# Patient Record
Sex: Female | Born: 1945 | Race: Black or African American | Hispanic: No | Smoking: Never smoker
Health system: Southern US, Community
[De-identification: ages and names within clinical notes are randomized; demographics above are authoritative.]

## PROBLEM LIST (undated history)

## (undated) DIAGNOSIS — I10 Essential (primary) hypertension: Secondary | ICD-10-CM

## (undated) DIAGNOSIS — K219 Gastro-esophageal reflux disease without esophagitis: Secondary | ICD-10-CM

## (undated) DIAGNOSIS — G8929 Other chronic pain: Secondary | ICD-10-CM

## (undated) DIAGNOSIS — G43909 Migraine, unspecified, not intractable, without status migrainosus: Secondary | ICD-10-CM

## (undated) DIAGNOSIS — E079 Disorder of thyroid, unspecified: Secondary | ICD-10-CM

## (undated) HISTORY — PX: THYROID SURGERY: SHX805

## (undated) HISTORY — PX: ABDOMINAL HYSTERECTOMY: SHX81

## (undated) HISTORY — PX: KNEE SURGERY: SHX244

---

## 2017-09-12 ENCOUNTER — Other Ambulatory Visit: Payer: Self-pay

## 2017-09-12 ENCOUNTER — Ambulatory Visit
Admission: EM | Admit: 2017-09-12 | Discharge: 2017-09-12 | Disposition: A | Discharge status: Home / Self Care | Payer: Medicare HMO | Attending: Family Medicine | Admitting: Family Medicine

## 2017-09-12 DIAGNOSIS — M1000 Idiopathic gout, unspecified site: Secondary | ICD-10-CM

## 2017-09-12 DIAGNOSIS — M25532 Pain in left wrist: Secondary | ICD-10-CM

## 2017-09-12 DIAGNOSIS — M25531 Pain in right wrist: Secondary | ICD-10-CM

## 2017-09-12 DIAGNOSIS — M79675 Pain in left toe(s): Secondary | ICD-10-CM

## 2017-09-12 DIAGNOSIS — M79674 Pain in right toe(s): Secondary | ICD-10-CM | POA: Diagnosis not present

## 2017-09-12 HISTORY — DX: Other chronic pain: G89.29

## 2017-09-12 HISTORY — DX: Essential (primary) hypertension: I10

## 2017-09-12 HISTORY — DX: Gastro-esophageal reflux disease without esophagitis: K21.9

## 2017-09-12 HISTORY — DX: Migraine, unspecified, not intractable, without status migrainosus: G43.909

## 2017-09-12 HISTORY — DX: Disorder of thyroid, unspecified: E07.9

## 2017-09-12 MED ORDER — PREDNISONE 20 MG PO TABS: 20.0000 mg | ORAL_TABLET | Freq: Every day | ORAL | 0 refills | Status: DC

## 2017-09-12 NOTE — ED Triage Notes (Signed)
Pt states she had a flare a few months ago where her hands joints were swelling. She took someone's Gout medication and it got better. Pt states since Thursday she is having both hand and feet joint pain. Pain 7/10

## 2017-09-13 NOTE — ED Provider Notes (Signed)
[facility]    CSN: [id number] Arrival date & time: 09/12/17  1338     History   Chief Complaint Chief Complaint  Patient presents with  . Hand Pain  . Foot Pain    HPI Debra Rice is a 72 y.o. female.   72 yo female with a big toe pain (left greater than right) as some mild hand/wrist pain after eating seafood 3 days ago. States she has a h/o gout with similar presentation in the past. Denies any falls, injuries, fevers, chills.   The history is provided by the patient.  Hand Pain   Foot Pain     Past Medical History:  Diagnosis Date  . Chronic pain   . GERD (gastroesophageal reflux disease)   . Hypertension   . Migraines   . Thyroid disease     There are no active problems to display for this patient.   Past Surgical History:  Procedure Laterality Date  . ABDOMINAL HYSTERECTOMY    . KNEE SURGERY    . THYROID SURGERY      OB History   None      Home Medications    Prior to Admission medications   Medication Sig Start Date End Date Taking? Authorizing Provider  budesonide-formoterol (SYMBICORT) 160-4.5 MCG/ACT inhaler Inhale into the lungs. 04/07/12  Yes [provider]  cyclobenzaprine (FLEXERIL) 10 MG tablet Take by mouth. 11/20/14  Yes [provider]  diclofenac sodium (VOLTAREN) 1 % GEL Reported on 06/07/2015 as needed 01/11/15  Yes [provider]  furosemide (LASIX) 40 MG tablet Take by mouth. 06/11/16  Yes [provider]  levothyroxine (SYNTHROID, LEVOTHROID) 137 MCG tablet Take by mouth. 08/25/13  Yes [provider]  omeprazole (PRILOSEC) 20 MG capsule Take by mouth. 07/14/13  Yes [provider]  oxyCODONE-acetaminophen (PERCOCET) 10-325 MG tablet TAKE 1 TO 2 TABLETS BY MOUTH THREE TIMES DAILY AS NEEDED FOR SEVERE PAIN 08/15/15  Yes [provider]  rizatriptan (MAXALT) 10 MG tablet TAKE ONE TABLET BY MOUTH FOR HEADACHE. may repeat once in 2 hours maximum of 3 tablets  daily 03/31/16  Yes [provider]  telmisartan (MICARDIS) 80 MG tablet Take by mouth. 04/28/16  Yes [provider]  topiramate (TOPAMAX) 50 MG tablet Take by mouth. 05/01/16  Yes [provider]  zolpidem (AMBIEN CR) 12.5 MG CR tablet Take by mouth. 07/04/13  Yes [provider]  albuterol (PROVENTIL HFA;VENTOLIN HFA) 108 (90 Base) MCG/ACT inhaler Inhale into the lungs.    [provider]  aspirin EC 81 MG tablet Take by mouth.    [provider]  hydrochlorothiazide (MICROZIDE) 12.5 MG capsule Take 12.5 mg by mouth daily. 07/06/17   [provider]  predniSONE (DELTASONE) 20 MG tablet Take 1 tablet (20 mg total) by mouth daily. 09/12/17   Payton Mccallumonty, Keishla Oyer, MD  telmisartan (MICARDIS) 80 MG tablet Take 80 mg by mouth daily. for blood pressure 08/06/17   [provider]    Family History History reviewed. No pertinent family history.  Social History Social History   Tobacco Use  . Smoking status: Never Smoker  . Smokeless tobacco: Never Used  Substance Use Topics  . Alcohol use: Not Currently  . Drug use: Not Currently     Allergies   Hydrocodone-acetaminophen; Oxycodone; Ciprofloxacin; Erythromycin; Iodine; and Sulfa antibiotics   Review of Systems Review of Systems   Physical Exam Triage Vital Signs ED Triage Vitals  Enc Vitals Group  BP 09/12/17 1352 (!) 145/70     Pulse Rate 09/12/17 1352 71     Resp 09/12/17 1352 18     Temp 09/12/17 1352 98 F (36.7 C)     Temp Source 09/12/17 1352 Oral     SpO2 09/12/17 1352 98 %     Weight 09/12/17 1355 (!) 308 lb (139.7 kg)     Height 09/12/17 1355 5\' 7"  (1.702 m)     Head Circumference --      Peak Flow --      Pain Score 09/12/17 1353 7     Pain Loc --      Pain Edu? --      Excl. in GC? --    No data found.  Updated Vital Signs BP (!) 145/70 (BP Location: Right Arm)   Pulse 71   Temp 98 F (36.7 C) (Oral)   Resp 18   Ht 5\' 7"  (1.702 m)   Wt (!)  139.7 kg   SpO2 98%   BMI 48.24 kg/m   Visual Acuity Right Eye Distance:   Left Eye Distance:   Bilateral Distance:    Right Eye Near:   Left Eye Near:    Bilateral Near:     Physical Exam  Constitutional: She appears well-developed and well-nourished. No distress.  Musculoskeletal:       Right hand: She exhibits swelling (mild).       Left hand: She exhibits swelling (mild).       Right foot: There is tenderness, bony tenderness and swelling. There is normal range of motion, normal capillary refill, no crepitus, no deformity and no laceration.       Left foot: There is tenderness, bony tenderness and swelling. There is normal range of motion, normal capillary refill, no crepitus, no deformity and no laceration.  To left MTP joint and right MTP joint  Skin: She is not diaphoretic.  Nursing note and vitals reviewed.    UC Treatments / Results  Labs (all labs ordered are listed, but only abnormal results are displayed) Labs Reviewed - No data to display  EKG None  Radiology No results found.  Procedures Procedures (including critical care time)  Medications Ordered in UC Medications - No data to display  Initial Impression / Assessment and Plan / UC Course  I have reviewed the triage vital signs and the nursing notes.  Pertinent labs & imaging results that were available during my care of the patient were reviewed by me and considered in my medical decision making (see chart for details).     Final Clinical Impressions(s) / UC Diagnoses   Final diagnoses:  Acute idiopathic gout, unspecified site   Discharge Instructions   None    ED Prescriptions    Medication Sig Dispense Auth. Provider   predniSONE (DELTASONE) 20 MG tablet Take 1 tablet (20 mg total) by mouth daily. 5 tablet Payton Mccallum, MD     1. diagnosis reviewed with patient 2. rx as per orders above; reviewed possible side effects, interactions, risks and benefits  3. Recommend supportive  treatment with otc analgesic prn 4. Follow-up prn if symptoms worsen or don't improve  Controlled Substance Prescriptions [place] Controlled Substance Registry consulted? Not Applicable   Payton Mccallum, MD 09/13/17 [phone]

## 2017-10-22 ENCOUNTER — Ambulatory Visit (INDEPENDENT_AMBULATORY_CARE_PROVIDER_SITE_OTHER): Payer: Medicare HMO

## 2017-10-22 ENCOUNTER — Other Ambulatory Visit: Payer: Self-pay

## 2017-10-22 ENCOUNTER — Ambulatory Visit
Admission: EM | Admit: 2017-10-22 | Discharge: 2017-10-22 | Disposition: A | Discharge status: Home / Self Care | Payer: Medicare HMO | Attending: Emergency Medicine | Admitting: Emergency Medicine

## 2017-10-22 DIAGNOSIS — S60222A Contusion of left hand, initial encounter: Secondary | ICD-10-CM | POA: Diagnosis not present

## 2017-10-22 DIAGNOSIS — M25532 Pain in left wrist: Secondary | ICD-10-CM | POA: Diagnosis not present

## 2017-10-22 DIAGNOSIS — R51 Headache: Secondary | ICD-10-CM

## 2017-10-22 DIAGNOSIS — S161XXA Strain of muscle, fascia and tendon at neck level, initial encounter: Secondary | ICD-10-CM | POA: Diagnosis not present

## 2017-10-22 DIAGNOSIS — R519 Headache, unspecified: Secondary | ICD-10-CM

## 2017-10-22 MED ORDER — BACLOFEN 5 MG PO TABS: 5.0000 mg | ORAL_TABLET | Freq: Two times a day (BID) | ORAL | 0 refills | Status: AC

## 2017-10-22 MED ORDER — PREDNISONE 20 MG PO TABS: 40.0000 mg | ORAL_TABLET | Freq: Every day | ORAL | 0 refills | Status: AC
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
## 2017-10-22 NOTE — Discharge Instructions (Addendum)
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  There is no fracture or dislocation.  You can try the Voltaren gel on this area in addition to your neck.  The prednisone will help with the swelling and also will help with the neck pain/headache.  Baclofen will help with the muscle spasms that she had on that left trapezius.  Ice your hand for 20 minutes at a time.  Go immediately to the ER for vomiting, fevers, lights bothering eyes, visual changes, strokelike symptoms, or for severe headache that does not respond to Tylenol.  Take 1 g of Tylenol 3 or 4 times a day as needed for pain.

## 2018-05-16 ENCOUNTER — Ambulatory Visit (INDEPENDENT_AMBULATORY_CARE_PROVIDER_SITE_OTHER): Payer: Medicare HMO

## 2018-05-16 ENCOUNTER — Ambulatory Visit
Admission: EM | Admit: 2018-05-16 | Discharge: 2018-05-16 | Disposition: A | Discharge status: Home / Self Care | Payer: Medicare HMO | Attending: Family Medicine | Admitting: Family Medicine

## 2018-05-16 ENCOUNTER — Other Ambulatory Visit: Payer: Self-pay

## 2018-05-16 DIAGNOSIS — M25532 Pain in left wrist: Secondary | ICD-10-CM

## 2018-05-16 DIAGNOSIS — M79671 Pain in right foot: Secondary | ICD-10-CM

## 2018-05-16 DIAGNOSIS — M542 Cervicalgia: Secondary | ICD-10-CM

## 2018-05-16 MED ORDER — PREDNISONE 20 MG PO TABS: 40.0000 mg | ORAL_TABLET | Freq: Every day | ORAL | 0 refills | Status: AC
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
## 2018-05-16 NOTE — Discharge Instructions (Signed)
Take medication as prescribed. Rest. Drink plenty of fluids. Ice/Heat.  Follow up with your primary care physician this week as needed. Return to Urgent care for new or worsening concerns.   

## 2018-12-13 ENCOUNTER — Other Ambulatory Visit: Payer: Self-pay

## 2018-12-13 ENCOUNTER — Ambulatory Visit
Admission: EM | Admit: 2018-12-13 | Discharge: 2018-12-13 | Disposition: A | Discharge status: Home / Self Care | Payer: Medicare HMO | Attending: Family Medicine | Admitting: Family Medicine

## 2018-12-13 DIAGNOSIS — R05 Cough: Secondary | ICD-10-CM

## 2018-12-13 DIAGNOSIS — R197 Diarrhea, unspecified: Secondary | ICD-10-CM

## 2018-12-13 DIAGNOSIS — B349 Viral infection, unspecified: Secondary | ICD-10-CM

## 2018-12-13 MED ORDER — DIPHENOXYLATE-ATROPINE 2.5-0.025 MG PO TABS
1.0000 | ORAL_TABLET | Freq: Four times a day (QID) | ORAL | 0 refills | Status: AC | PRN
Start: 2018-12-13 — End: ?

## 2018-12-13 MED ORDER — BENZONATATE 200 MG PO CAPS: 200.0000 mg | ORAL_CAPSULE | Freq: Three times a day (TID) | ORAL | 0 refills | Status: AC | PRN

## 2018-12-13 NOTE — Discharge Instructions (Signed)
Rest.  Fluids.  Medication as prescribed.  Awaiting COVID test results.

## 2018-12-13 NOTE — ED Triage Notes (Signed)
Pt with Diarrhea and cough since Saturday

## 2018-12-13 NOTE — ED Provider Notes (Signed)
[facility]    CSN: [id number] Arrival date & time: 12/13/18  1635  History   Chief Complaint Chief Complaint  Patient presents with  . Diarrhea  . Cough   HPI  73 year old female presents with diarrhea and cough.  Patient reports that her symptoms started on Saturday.  She reports cough and diarrhea.  Her grandson has similar symptoms.  No reported sick contacts other than her grandson.  No fever.  She has tried some over-the-counter medication without resolution.  No known exacerbating factors.  She is concerned about the possibility for COVID-19.  No other associated symptoms.  No other complaints.  PMH, Surgical Hx, Family Hx, Social History reviewed and updated as below.  Past Medical History:  Diagnosis Date  . Chronic pain   . GERD (gastroesophageal reflux disease)   . Hypertension   . Migraines   . Thyroid disease    Past Surgical History:  Procedure Laterality Date  . ABDOMINAL HYSTERECTOMY    . KNEE SURGERY    . THYROID SURGERY     OB History   No obstetric history on file.    Home Medications    Prior to Admission medications   Medication Sig Start Date End Date Taking? Authorizing Provider  albuterol (PROVENTIL HFA;VENTOLIN HFA) 108 (90 Base) MCG/ACT inhaler Inhale into the lungs.    [provider]  aspirin EC 81 MG tablet Take by mouth.    [provider]  benzonatate (TESSALON) 200 MG capsule Take 1 capsule (200 mg total) by mouth 3 (three) times daily as needed for cough. 12/13/18   Tommie Samsook, Yanice Maqueda G, DO  budesonide-formoterol (SYMBICORT) 160-4.5 MCG/ACT inhaler Inhale into the lungs. 04/07/12   [provider]  Butalbital-APAP-Caffeine 50-300-40 MG CAPS Take by mouth. 05/14/18   [provider]  cyclobenzaprine (FLEXERIL) 10 MG tablet Take by mouth. 11/20/14   [provider]  diclofenac sodium (VOLTAREN) 1 % GEL Reported on 06/07/2015 as needed 01/11/15   [provider]  diphenoxylate-atropine  (LOMOTIL) 2.5-0.025 MG tablet Take 1 tablet by mouth 4 (four) times daily as needed for diarrhea or loose stools. 12/13/18   Tommie Samsook, Michaiah Maiden G, DO  furosemide (LASIX) 40 MG tablet Take by mouth. 06/11/16   [provider]  hydrochlorothiazide (MICROZIDE) 12.5 MG capsule Take 12.5 mg by mouth daily. 07/06/17   [provider]  levothyroxine (SYNTHROID, LEVOTHROID) 137 MCG tablet Take by mouth. 08/25/13   [provider]  omeprazole (PRILOSEC) 20 MG capsule Take by mouth. 07/14/13   [provider]  oxyCODONE-acetaminophen (PERCOCET) 10-325 MG tablet TAKE 1 TO 2 TABLETS BY MOUTH THREE TIMES DAILY AS NEEDED FOR SEVERE PAIN 08/15/15   [provider]  predniSONE (DELTASONE) 20 MG tablet Take 2 tablets (40 mg total) by mouth daily. 05/16/18   Renford DillsMiller, [place], NP  rizatriptan (MAXALT) 10 MG tablet TAKE ONE TABLET BY MOUTH FOR HEADACHE. may repeat once in 2 hours maximum of 3 tablets daily 03/31/16   [provider]  telmisartan (MICARDIS) 80 MG tablet Take 80 mg by mouth daily. for blood pressure 08/06/17   [provider]  topiramate (TOPAMAX) 50 MG tablet Take by mouth. 05/01/16   [provider]   Social History Social History   Tobacco Use  . Smoking status: Never Smoker  . Smokeless tobacco: Never Used  Substance Use Topics  . Alcohol use: Not Currently  . Drug use: Not Currently    Allergies   Hydrocodone-acetaminophen, Oxycodone, Ciprofloxacin, Erythromycin, Iodine, and  Sulfa antibiotics   Review of Systems Review of Systems  Respiratory: Positive for cough.   Gastrointestinal: Positive for diarrhea.   Physical Exam Triage Vital Signs ED Triage Vitals  Enc Vitals Group     BP 12/13/18 1647 123/79     Pulse Rate 12/13/18 1647 79     Resp 12/13/18 1647 18     Temp 12/13/18 1647 98.6 F (37 C)     Temp Source 12/13/18 1647 Oral     SpO2 12/13/18 1647 99 %     Weight 12/13/18 1648 295 lb (133.8 kg)     Height 12/13/18 1648  5\' 7"  (1.702 m)     Head Circumference --      Peak Flow --      Pain Score 12/13/18 1647 0     Pain Loc --      Pain Edu? --      Excl. in GC? --    Updated Vital Signs BP 123/79 (BP Location: Left Arm)   Pulse 79   Temp 98.6 F (37 C) (Oral)   Resp 18   Ht 5\' 7"  (1.702 m)   Wt 133.8 kg   SpO2 99%   BMI 46.20 kg/m   Visual Acuity Right Eye Distance:   Left Eye Distance:   Bilateral Distance:    Right Eye Near:   Left Eye Near:    Bilateral Near:     Physical Exam Vitals signs and nursing note reviewed.  Constitutional:      General: She is not in acute distress.    Appearance: Normal appearance. She is obese. She is not ill-appearing.  HENT:     Head: Normocephalic and atraumatic.  Eyes:     General:        Right eye: No discharge.        Left eye: No discharge.     Conjunctiva/sclera: Conjunctivae normal.  Cardiovascular:     Rate and Rhythm: Normal rate and regular rhythm.     Heart sounds: No murmur.  Pulmonary:     Effort: Pulmonary effort is normal.     Breath sounds: Normal breath sounds. No wheezing, rhonchi or rales.  Abdominal:     General: There is no distension.     Palpations: Abdomen is soft.     Tenderness: There is no abdominal tenderness.  Neurological:     Mental Status: She is alert.  Psychiatric:        Mood and Affect: Mood normal.        Behavior: Behavior normal.    UC Treatments / Results  Labs (all labs ordered are listed, but only abnormal results are displayed) Labs Reviewed  NOVEL CORONAVIRUS, NAA (HOSPITAL ORDER, SEND-OUT TO REF LAB)    EKG   Radiology No results found.  Procedures Procedures (including critical care time)  Medications Ordered in UC Medications - No data to display  Initial Impression / Assessment and Plan / UC Course  I have reviewed the triage vital signs and the nursing notes.  Pertinent labs & imaging results that were available during my care of the patient were reviewed by me and  considered in my medical decision making (see chart for details).    73 year old female presents with a suspected viral illness.  Awaiting Covid test results.  Tessalon Perles for cough.  Lomotil for diarrhea.  Supportive care.  Final Clinical Impressions(s) / UC Diagnoses   Final diagnoses:  Viral illness     Discharge Instructions  Rest.  Fluids.  Medication as prescribed.  Awaiting COVID test results.   ED Prescriptions    Medication Sig Dispense Auth. Provider   diphenoxylate-atropine (LOMOTIL) 2.5-0.025 MG tablet Take 1 tablet by mouth 4 (four) times daily as needed for diarrhea or loose stools. 30 tablet Vyctoria Dickman G, DO   benzonatate (TESSALON) 200 MG capsule Take 1 capsule (200 mg total) by mouth 3 (three) times daily as needed for cough. 20 capsule Coral Spikes, DO     PDMP not reviewed this encounter.   Coral Spikes, [place] 12/13/18 2027

## 2018-12-15 LAB — NOVEL CORONAVIRUS, NAA (HOSP ORDER, SEND-OUT TO REF LAB; TAT 18-24 HRS): SARS-CoV-2, NAA: NOT DETECTED

## 2020-02-17 ENCOUNTER — Ambulatory Visit
Admission: EM | Admit: 2020-02-17 | Discharge: 2020-02-17 | Disposition: A | Discharge status: Home / Self Care | Payer: Medicare HMO | Attending: Family Medicine | Admitting: Family Medicine

## 2020-02-17 ENCOUNTER — Other Ambulatory Visit: Payer: Self-pay

## 2020-02-17 DIAGNOSIS — Z20822 Contact with and (suspected) exposure to covid-19: Secondary | ICD-10-CM | POA: Diagnosis not present

## 2020-02-17 NOTE — ED Triage Notes (Signed)
Patient states that she was exposed to covid and would like to be tested, no symptoms.

## 2020-02-17 NOTE — Discharge Instructions (Signed)

## 2020-02-18 LAB — SARS CORONAVIRUS 2 (TAT 6-24 HRS): SARS Coronavirus 2: NEGATIVE

## 2020-06-15 ENCOUNTER — Ambulatory Visit
Admission: EM | Admit: 2020-06-15 | Discharge: 2020-06-15 | Disposition: A | Discharge status: Home / Self Care | Payer: Medicare HMO | Attending: Sports Medicine | Admitting: Sports Medicine

## 2020-06-15 ENCOUNTER — Other Ambulatory Visit: Payer: Self-pay

## 2020-06-15 DIAGNOSIS — Z20822 Contact with and (suspected) exposure to covid-19: Secondary | ICD-10-CM | POA: Insufficient documentation

## 2020-06-15 LAB — SARS CORONAVIRUS 2 (TAT 6-24 HRS): SARS Coronavirus 2: NEGATIVE

## 2020-06-15 NOTE — ED Triage Notes (Signed)
Pt presents to [facility] for covid testing. Pr denies symptoms at this time.

## 2020-06-15 NOTE — Discharge Instructions (Signed)

## 2020-10-19 ENCOUNTER — Other Ambulatory Visit: Payer: Self-pay

## 2020-10-19 ENCOUNTER — Encounter: Payer: Self-pay | Admitting: Emergency Medicine

## 2020-10-19 ENCOUNTER — Ambulatory Visit
Admission: EM | Admit: 2020-10-19 | Discharge: 2020-10-19 | Disposition: A | Discharge status: Home / Self Care | Payer: Medicare HMO | Attending: Physician Assistant | Admitting: Physician Assistant

## 2020-10-19 DIAGNOSIS — Z0189 Encounter for other specified special examinations: Secondary | ICD-10-CM | POA: Diagnosis not present

## 2020-10-19 DIAGNOSIS — Z20822 Contact with and (suspected) exposure to covid-19: Secondary | ICD-10-CM | POA: Insufficient documentation

## 2020-10-19 DIAGNOSIS — Z1152 Encounter for screening for COVID-19: Secondary | ICD-10-CM | POA: Diagnosis not present

## 2020-10-19 LAB — SARS CORONAVIRUS 2 (TAT 6-24 HRS): SARS Coronavirus 2: NEGATIVE

## 2020-10-19 NOTE — ED Triage Notes (Signed)
Patient states that she needs to be tested for COVID.  Patient states that she is not having any symptoms.

## 2020-10-19 NOTE — Discharge Instructions (Signed)

## 2020-10-24 ENCOUNTER — Ambulatory Visit
Admission: EM | Admit: 2020-10-24 | Discharge: 2020-10-24 | Disposition: A | Discharge status: Home / Self Care | Payer: Medicare HMO | Attending: Family Medicine | Admitting: Family Medicine

## 2020-10-24 ENCOUNTER — Other Ambulatory Visit: Payer: Self-pay

## 2020-10-24 DIAGNOSIS — Z20822 Contact with and (suspected) exposure to covid-19: Secondary | ICD-10-CM | POA: Insufficient documentation

## 2020-10-24 NOTE — ED Triage Notes (Signed)
Pt requests COVID screening due to possible exposure via a family member. 

## 2020-10-25 ENCOUNTER — Telehealth: Payer: Self-pay

## 2020-10-25 LAB — SARS CORONAVIRUS 2 (TAT 6-24 HRS): SARS Coronavirus 2: NEGATIVE

## 2020-10-25 NOTE — Telephone Encounter (Signed)
Negative COVID results given. Patient results "NOT Detected." Caller expressed understanding. ° °
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
## 2022-07-20 ENCOUNTER — Ambulatory Visit
Admission: EM | Admit: 2022-07-20 | Discharge: 2022-07-20 | Disposition: A | Discharge status: Home / Self Care | Attending: Family | Admitting: Family

## 2022-07-20 ENCOUNTER — Encounter: Payer: Self-pay | Admitting: *Deleted

## 2022-07-20 ENCOUNTER — Other Ambulatory Visit: Payer: Self-pay

## 2022-07-20 DIAGNOSIS — J069 Acute upper respiratory infection, unspecified: Secondary | ICD-10-CM

## 2022-07-20 MED ORDER — PROMETHAZINE-DM 6.25-15 MG/5ML PO SYRP: 5.0000 mL | ORAL_SOLUTION | Freq: Four times a day (QID) | ORAL | 0 refills | Status: AC | PRN

## 2022-07-20 MED ORDER — FLUTICASONE PROPIONATE 50 MCG/ACT NA SUSP: 1.0000 | Freq: Every day | NASAL | 0 refills | Status: AC

## 2022-07-20 MED ORDER — BENZONATATE 100 MG PO CAPS: 100.0000 mg | ORAL_CAPSULE | Freq: Three times a day (TID) | ORAL | 0 refills | Status: AC | PRN

## 2022-07-20 NOTE — ED Triage Notes (Signed)
Pt reports going to a meeting  and has had a cough and vomiting since.

## 2022-07-20 NOTE — ED Provider Notes (Signed)
[facility]    CSN: [id number] Arrival date & time: 07/20/22  0820      History   Chief Complaint Chief Complaint  Patient presents with   Cough   Emesis    HPI Debra Rice is a 77 y.o. female presenting to the care this morning with complaints of cough and some chills.  She reports she went to [facility] for a [place] conference on Friday, where everybody was coughing and a few hours later she started experiencing chills and had to leave the room. Had emesis x1. Patient left the conference and went to her room with the chills that continued, reports decrease in appetite. Since then patient has been having dry cough and runny nose.  Has been drinking enough water report and has been using [employer] as needed for symptoms.  She reports decreased appetite. Denies fever, chest pain, shortness of breath, sore throat, headaches, or tiredness.  Cough Associated symptoms: rhinorrhea   Emesis Associated symptoms: cough     Past Medical History:  Diagnosis Date   Chronic pain    GERD (gastroesophageal reflux disease)    Hypertension    Migraines    Thyroid disease     There are no problems to display for this patient.   Past Surgical History:  Procedure Laterality Date   ABDOMINAL HYSTERECTOMY     KNEE SURGERY     THYROID SURGERY      OB History   No obstetric history on file.      Home Medications    Prior to Admission medications   Medication Sig Start Date End Date Taking? Authorizing Provider  albuterol (PROVENTIL HFA;VENTOLIN HFA) 108 (90 Base) MCG/ACT inhaler Inhale into the lungs.   Yes [provider]  aspirin EC 81 MG tablet Take by mouth.   Yes [provider]  benzonatate (TESSALON PERLES) 100 MG capsule Take 1 capsule (100 mg total) by mouth 3 (three) times daily as needed for up to 7 days for cough. 07/20/22 07/27/22 Yes Eleonore Chiquito, FNP  benzonatate (TESSALON) 200 MG capsule Take 1 capsule (200 mg total) by mouth  3 (three) times daily as needed for cough. 12/13/18  Yes Cook, Jayce G, DO  budesonide-formoterol (SYMBICORT) 160-4.5 MCG/ACT inhaler Inhale into the lungs. 04/07/12  Yes [provider]  Butalbital-APAP-Caffeine 50-300-40 MG CAPS Take by mouth. 05/14/18  Yes [provider]  cyclobenzaprine (FLEXERIL) 10 MG tablet Take by mouth. 11/20/14  Yes [provider]  diclofenac sodium (VOLTAREN) 1 % GEL Reported on 06/07/2015 as needed 01/11/15  Yes [provider]  diphenoxylate-atropine (LOMOTIL) 2.5-0.025 MG tablet Take 1 tablet by mouth 4 (four) times daily as needed for diarrhea or loose stools. 12/13/18  Yes Cook, Jayce G, DO  fluticasone (FLONASE) 50 MCG/ACT nasal spray Place 1 spray into both nostrils daily for 14 days. 07/20/22 08/03/22 Yes Eleonore Chiquito, FNP  furosemide (LASIX) 40 MG tablet Take by mouth. 06/11/16  Yes [provider]  hydrochlorothiazide (MICROZIDE) 12.5 MG capsule Take 12.5 mg by mouth daily. 07/06/17  Yes [provider]  levothyroxine (SYNTHROID, LEVOTHROID) 137 MCG tablet Take by mouth. 08/25/13  Yes [provider]  omeprazole (PRILOSEC) 20 MG capsule Take by mouth. 07/14/13  Yes [provider]  oxyCODONE-acetaminophen (PERCOCET) 10-325 MG tablet TAKE 1 TO 2 TABLETS BY MOUTH THREE TIMES DAILY AS NEEDED FOR SEVERE PAIN 08/15/15  Yes [provider]  promethazine-dextromethorphan (PROMETHAZINE-DM) 6.25-15 MG/5ML syrup Take 5 mLs by mouth 4 (four)  times daily as needed for cough. 07/20/22  Yes Eleonore Chiquito, FNP  telmisartan (MICARDIS) 80 MG tablet Take 80 mg by mouth daily. for blood pressure 08/06/17  Yes [provider]  topiramate (TOPAMAX) 50 MG tablet Take by mouth. 05/01/16  Yes [provider]  predniSONE (DELTASONE) 20 MG tablet Take 2 tablets (40 mg total) by mouth daily. 05/16/18   Renford Dills, NP  rizatriptan (MAXALT) 10 MG tablet TAKE ONE TABLET BY MOUTH FOR HEADACHE. may repeat  once in 2 hours maximum of 3 tablets daily 03/31/16   [provider]    Family History History reviewed. No pertinent family history.  Social History Social History   Tobacco Use   Smoking status: Never   Smokeless tobacco: Never  Substance Use Topics   Alcohol use: Not Currently   Drug use: Not Currently     Allergies   Hydrocodone-acetaminophen, Oxycodone, Ciprofloxacin, Erythromycin, Iodine, and Sulfa antibiotics   Review of Systems Review of Systems  Constitutional: Negative.   HENT:  Positive for congestion and rhinorrhea.   Eyes: Negative.   Respiratory:  Positive for cough.   Cardiovascular: Negative.   Gastrointestinal:  Positive for vomiting.  Endocrine: Negative.   Genitourinary: Negative.   Allergic/Immunologic: Negative.      Physical Exam Triage Vital Signs ED Triage Vitals  Enc Vitals Group     BP 07/20/22 0852 102/64     Pulse Rate 07/20/22 0852 85     Resp 07/20/22 0852 18     Temp 07/20/22 0852 98.7 F (37.1 C)     Temp src --      SpO2 07/20/22 0852 96 %     Weight --      Height --      Head Circumference --      Peak Flow --      Pain Score 07/20/22 0848 9     Pain Loc --      Pain Edu? --      Excl. in GC? --    No data found.  Updated Vital Signs BP 102/64   Pulse 85   Temp 98.7 F (37.1 C)   Resp 18   SpO2 96%   Visual Acuity Right Eye Distance:   Left Eye Distance:   Bilateral Distance:    Right Eye Near:   Left Eye Near:    Bilateral Near:     Physical Exam Constitutional:      Appearance: Normal appearance.  HENT:     Head: Normocephalic and atraumatic.     Right Ear: Tympanic membrane normal.     Left Ear: Tympanic membrane normal.     Nose: Rhinorrhea present.  Eyes:     Pupils: Pupils are equal, round, and reactive to light.  Cardiovascular:     Rate and Rhythm: Normal rate and regular rhythm.  Pulmonary:     Effort: Pulmonary effort is normal.     Breath sounds: Normal breath sounds.   Abdominal:     General: Bowel sounds are normal.  Musculoskeletal:     Cervical back: Normal range of motion.  Neurological:     Mental Status: She is alert.      UC Treatments / Results  Labs (all labs ordered are listed, but only abnormal results are displayed) Labs Reviewed - No data to display  EKG   Radiology No results found.  Procedures Procedures (including critical care time)  Medications Ordered in UC Medications - No data to display  Initial Impression / Assessment and Plan / UC Course  I have reviewed the triage vital signs and the nursing notes.  Pertinent labs & imaging results that were available during my care of the patient were reviewed by me and considered in my medical decision making (see chart for details).     Is a 77 year old female here with complaints of cough and chills after going to a conference in [place] on Friday.  Has been treating her symptoms with salon Per[employer], continues to have runny nose and her appetite is decreased ports no chest pain or shortness of breath. Patient is experiencing upper respiratory infection.  Patient is prescribed benzonatate 100 mg to take 1 capsule 3 times a day as needed for cough, Flonase to apply 1 spray into both nostrils once a day and Promethazine DM to take as prescribed for symptoms.  Reminded to report new or worsening symptoms to her primary care doctor (patient reports she is going to call PCP in the morning) or local ED.   Final Clinical Impressions(s) / UC Diagnoses   Final diagnoses:  Upper respiratory infection, viral     Discharge Instructions      -Take benzonatate 100 mg 3 times daily for cough -Use Flonase as instructed for your symptoms -Tylenol as needed for fever or chills and stay hydrated. -Report new or worsening symptoms to your primary care doctor or local emergency room.    ED Prescriptions     Medication Sig Dispense Auth. Provider   benzonatate (TESSALON PERLES) 100 MG  capsule Take 1 capsule (100 mg total) by mouth 3 (three) times daily as needed for up to 7 days for cough. 20 capsule Eleonore Chiquito, FNP   fluticasone (FLONASE) 50 MCG/ACT nasal spray Place 1 spray into both nostrils daily for 14 days. 1 mL Eleonore Chiquito, FNP   promethazine-dextromethorphan (PROMETHAZINE-DM) 6.25-15 MG/5ML syrup Take 5 mLs by mouth 4 (four) times daily as needed for cough. 118 mL Eleonore Chiquito, FNP      PDMP not reviewed this encounter.   Eleonore Chiquito, FNP 07/20/22 [phone]

## 2022-07-20 NOTE — Discharge Instructions (Addendum)
-  Take benzonatate 100 mg 3 times daily for cough -Use Flonase as instructed for your symptoms -Tylenol as needed for fever or chills and stay hydrated. -Report new or worsening symptoms to your primary care doctor or local emergency room.

## 2023-09-27 ENCOUNTER — Ambulatory Visit
Admission: EM | Admit: 2023-09-27 | Discharge: 2023-09-27 | Disposition: A | Discharge status: Home / Self Care | Attending: Emergency Medicine | Admitting: Emergency Medicine

## 2023-09-27 ENCOUNTER — Encounter: Payer: Self-pay | Admitting: Emergency Medicine

## 2023-09-27 DIAGNOSIS — L03114 Cellulitis of left upper limb: Secondary | ICD-10-CM

## 2023-09-27 DIAGNOSIS — W57XXXA Bitten or stung by nonvenomous insect and other nonvenomous arthropods, initial encounter: Secondary | ICD-10-CM

## 2023-09-27 DIAGNOSIS — S40862A Insect bite (nonvenomous) of left upper arm, initial encounter: Secondary | ICD-10-CM

## 2023-09-27 MED ORDER — DOXYCYCLINE HYCLATE 100 MG PO CAPS: 100.0000 mg | ORAL_CAPSULE | Freq: Two times a day (BID) | ORAL | 0 refills | Status: AC

## 2023-09-27 MED ORDER — TRIAMCINOLONE ACETONIDE 0.5 % EX OINT: 1.0000 | TOPICAL_OINTMENT | Freq: Two times a day (BID) | CUTANEOUS | 0 refills | Status: AC

## 2023-09-27 NOTE — ED Triage Notes (Signed)
 Patient states that she was lying in bed this morning when she thinks something bit her left upper arm.  Patient reports itching at the site.  Patient needs a work note.  Patient took Benadryl this morning.

## 2023-09-27 NOTE — ED Provider Notes (Signed)
 [facility]    CSN: [id number] Arrival date & time: 09/27/23  1200      History   Chief Complaint Chief Complaint  Patient presents with   Insect Bite    Left upper arm    HPI Debra Rice is a 78 y.o. female.   HPI  18 old female with past medical history significant for thyroid disease, migraine headaches, hypertension, GERD, chronic pain presents for evaluation of a painful itchy bite to the back of her left upper arm.  She reports that she was laying in bed this morning and she felt something bite her.  She did not see what bit her.  The area has continued to be painful and itchy.  No fever or drainage.  Past Medical History:  Diagnosis Date   Chronic pain    GERD (gastroesophageal reflux disease)    Hypertension    Migraines    Thyroid disease     There are no active problems to display for this patient.   Past Surgical History:  Procedure Laterality Date   ABDOMINAL HYSTERECTOMY     KNEE SURGERY     THYROID SURGERY      OB History   No obstetric history on file.      Home Medications    Prior to Admission medications   Medication Sig Start Date End Date Taking? Authorizing Provider  doxycycline  (VIBRAMYCIN ) 100 MG capsule Take 1 capsule (100 mg total) by mouth 2 (two) times daily for 7 days. 09/27/23 10/04/23 Yes Bernardino Ditch, NP  triamcinolone  ointment (KENALOG ) 0.5 % Apply 1 Application topically 2 (two) times daily. 09/27/23  Yes Bernardino Ditch, NP  albuterol (PROVENTIL HFA;VENTOLIN HFA) 108 (90 Base) MCG/ACT inhaler Inhale into the lungs.    [provider]  aspirin EC 81 MG tablet Take by mouth.    [provider]  benzonatate  (TESSALON ) 200 MG capsule Take 1 capsule (200 mg total) by mouth 3 (three) times daily as needed for cough. 12/13/18   Cook, Jayce G, DO  budesonide-formoterol (SYMBICORT) 160-4.5 MCG/ACT inhaler Inhale into the lungs. 04/07/12   [provider]  Butalbital-APAP-Caffeine 50-300-40 MG  CAPS Take by mouth. 05/14/18   [provider]  cyclobenzaprine (FLEXERIL) 10 MG tablet Take by mouth. 11/20/14   [provider]  diclofenac sodium (VOLTAREN) 1 % GEL Reported on 06/07/2015 as needed 01/11/15   [provider]  diphenoxylate -atropine  (LOMOTIL ) 2.5-0.025 MG tablet Take 1 tablet by mouth 4 (four) times daily as needed for diarrhea or loose stools. 12/13/18   Cook, Jayce G, DO  fluticasone  (FLONASE ) 50 MCG/ACT nasal spray Place 1 spray into both nostrils daily for 14 days. 07/20/22 08/03/22  Waldron Pac, FNP  furosemide (LASIX) 40 MG tablet Take by mouth. 06/11/16   [provider]  hydrochlorothiazide (MICROZIDE) 12.5 MG capsule Take 12.5 mg by mouth daily. 07/06/17   [provider]  levothyroxine (SYNTHROID, LEVOTHROID) 137 MCG tablet Take by mouth. 08/25/13   [provider]  omeprazole (PRILOSEC) 20 MG capsule Take by mouth. 07/14/13   [provider]  oxyCODONE-acetaminophen (PERCOCET) 10-325 MG tablet TAKE 1 TO 2 TABLETS BY MOUTH THREE TIMES DAILY AS NEEDED FOR SEVERE PAIN 08/15/15   [provider]  predniSONE  (DELTASONE ) 20 MG tablet Take 2 tablets (40 mg total) by mouth daily. 05/16/18   Cleotilde Jacobsen, NP  promethazine -dextromethorphan (PROMETHAZINE -DM) 6.25-15 MG/5ML syrup Take 5 mLs by mouth 4 (four) times daily as needed for cough. 07/20/22  Waldron Pac, FNP  rizatriptan (MAXALT) 10 MG tablet TAKE ONE TABLET BY MOUTH FOR HEADACHE. may repeat once in 2 hours maximum of 3 tablets daily 03/31/16   [provider]  telmisartan (MICARDIS) 80 MG tablet Take 80 mg by mouth daily. for blood pressure 08/06/17   [provider]  topiramate (TOPAMAX) 50 MG tablet Take by mouth. 05/01/16   [provider]    Family History History reviewed. No pertinent family history.  Social History Social History   Tobacco Use   Smoking status: Never   Smokeless tobacco: Never  Vaping Use   Vaping  status: Never Used  Substance Use Topics   Alcohol use: Not Currently   Drug use: Not Currently     Allergies   Hydrocodone-acetaminophen, Oxycodone, Ciprofloxacin, Erythromycin, Iodine, and Sulfa antibiotics   Review of Systems Review of Systems  Constitutional:  Negative for fever.  Skin:  Positive for color change and wound.     Physical Exam Triage Vital Signs ED Triage Vitals  Encounter Vitals Group     BP      Girls Systolic BP Percentile      Girls Diastolic BP Percentile      Boys Systolic BP Percentile      Boys Diastolic BP Percentile      Pulse      Resp      Temp      Temp src      SpO2      Weight      Height      Head Circumference      Peak Flow      Pain Score      Pain Loc      Pain Education      Exclude from Growth Chart    No data found.  Updated Vital Signs BP 127/80 (BP Location: Right Arm)   Pulse 65   Temp 97.7 F (36.5 C) (Oral)   Resp 14   Ht 5' 7 (1.702 m)   Wt 170 lb (77.1 kg)   BMI 26.63 kg/m   Visual Acuity Right Eye Distance:   Left Eye Distance:   Bilateral Distance:    Right Eye Near:   Left Eye Near:    Bilateral Near:     Physical Exam Vitals and nursing note reviewed.  Constitutional:      Appearance: Normal appearance. She is not ill-appearing.  HENT:     Head: Normocephalic and atraumatic.  Skin:    General: Skin is warm and dry.     Capillary Refill: Capillary refill takes less than 2 seconds.     Findings: Erythema and lesion present. No rash.  Neurological:     General: No focal deficit present.     Mental Status: She is alert and oriented to person, place, and time.      UC Treatments / Results  Labs (all labs ordered are listed, but only abnormal results are displayed) Labs Reviewed - No data to display  EKG   Radiology No results found.  Procedures Procedures (including critical care time)  Medications Ordered in UC Medications - No data to display  Initial Impression /  Assessment and Plan / UC Course  I have reviewed the triage vital signs and the nursing notes.  Pertinent labs & imaging results that were available during my care of the patient were reviewed by me and considered in my medical decision making (see chart for details).   Patient is a  pleasant, nontoxic-appearing 23 old female presenting for evaluation of an insect bite to the posterior aspect of her left upper arm as outlined in HPI above.  As you can see in image above, there is an erythematous, maculopapular lesion approximately the size of a dime on the central posterior aspect of the patient's left upper arm.  I do not find a site of envenomation.  However, the area is markedly tender to touch and warm.  I will place patient on doxycycline  100 mg twice daily for 7 days as prophylaxis against infection as well as prescribe triamcinolone  0.5% ointment that she can apply twice daily to help with the pain and itching.  Return precautions reviewed.  Work note provided.   Final Clinical Impressions(s) / UC Diagnoses   Final diagnoses:  Cellulitis of left upper extremity  Insect bite of left upper arm, initial encounter     Discharge Instructions      Take the doxycycline  twice daily with food for 7 days to prevent infection from your insect bite.  You may apply the triamcinolone  ointment twice daily to help with itching and pain.  You may also apply ice to the area for 20 minutes at a time, 2-3 times a day, to help with pain and itching.  If you develop any increased redness, swelling, pus drainage from the bite site, or fever please return for reevaluation or see your primary care provider.     ED Prescriptions     Medication Sig Dispense Auth. Provider   doxycycline  (VIBRAMYCIN ) 100 MG capsule Take 1 capsule (100 mg total) by mouth 2 (two) times daily for 7 days. 14 capsule Bernardino Ditch, NP   triamcinolone  ointment (KENALOG ) 0.5 % Apply 1 Application topically 2 (two) times daily.  30 g Bernardino Ditch, NP      PDMP not reviewed this encounter.   Bernardino Ditch, NP 09/27/23 1241

## 2023-09-27 NOTE — Discharge Instructions (Signed)
 Take the doxycycline  twice daily with food for 7 days to prevent infection from your insect bite.  You may apply the triamcinolone  ointment twice daily to help with itching and pain.  You may also apply ice to the area for 20 minutes at a time, 2-3 times a day, to help with pain and itching.  If you develop any increased redness, swelling, pus drainage from the bite site, or fever please return for reevaluation or see your primary care provider.
